# Patient Record
Sex: Female | Born: 1980 | Race: White | Hispanic: No | Marital: Married | Smoking: Never smoker
Health system: Southern US, Community
[De-identification: ages and names within clinical notes are randomized; demographics above are authoritative.]

---

## 2003-04-23 ENCOUNTER — Emergency Department (HOSPITAL_COMMUNITY): Admission: EM | Admit: 2003-04-23 | Discharge: 2003-04-24 | Payer: Self-pay | Admitting: Emergency Medicine

## 2003-04-24 ENCOUNTER — Encounter: Payer: Self-pay | Admitting: Emergency Medicine

## 2003-11-15 ENCOUNTER — Ambulatory Visit (HOSPITAL_COMMUNITY): Admission: RE | Admit: 2003-11-15 | Discharge: 2003-11-15 | Payer: Self-pay | Admitting: Family Medicine

## 2003-12-02 ENCOUNTER — Other Ambulatory Visit: Admission: RE | Admit: 2003-12-02 | Discharge: 2003-12-02 | Payer: Self-pay | Admitting: Obstetrics and Gynecology

## 2004-12-21 ENCOUNTER — Other Ambulatory Visit: Admission: RE | Admit: 2004-12-21 | Discharge: 2004-12-21 | Payer: Self-pay | Admitting: Obstetrics and Gynecology

## 2005-03-11 ENCOUNTER — Ambulatory Visit: Payer: Self-pay | Admitting: Internal Medicine

## 2005-06-05 ENCOUNTER — Ambulatory Visit (HOSPITAL_COMMUNITY): Admission: RE | Admit: 2005-06-05 | Discharge: 2005-06-05 | Payer: Self-pay | Admitting: Gynecology

## 2006-03-27 ENCOUNTER — Inpatient Hospital Stay (HOSPITAL_COMMUNITY): Admission: AD | Admit: 2006-03-27 | Discharge: 2006-03-30 | Payer: Self-pay | Admitting: Obstetrics and Gynecology

## 2007-05-08 ENCOUNTER — Inpatient Hospital Stay (HOSPITAL_COMMUNITY): Admission: RE | Admit: 2007-05-08 | Discharge: 2007-05-11 | Payer: Self-pay | Admitting: Obstetrics and Gynecology

## 2008-05-04 ENCOUNTER — Emergency Department (HOSPITAL_BASED_OUTPATIENT_CLINIC_OR_DEPARTMENT_OTHER): Admission: EM | Admit: 2008-05-04 | Discharge: 2008-05-04 | Payer: Self-pay | Admitting: Emergency Medicine

## 2009-10-04 ENCOUNTER — Emergency Department (HOSPITAL_BASED_OUTPATIENT_CLINIC_OR_DEPARTMENT_OTHER): Admission: EM | Admit: 2009-10-04 | Discharge: 2009-10-04 | Payer: Self-pay | Admitting: Emergency Medicine

## 2010-12-02 LAB — URINALYSIS, ROUTINE W REFLEX MICROSCOPIC
Glucose, UA: NEGATIVE mg/dL
Hgb urine dipstick: NEGATIVE
Specific Gravity, Urine: 1.024 (ref 1.005–1.030)

## 2011-01-29 NOTE — Op Note (Signed)
NAMELevita, [name]                 ACCOUNT NO.:  [id number]   MEDICAL RECORD NO.:  [id number]          PATIENT TYPE:  INP   LOCATION:  NA                            [facility]  [facility]   PHYSICIAN:  Carrington Clamp, M.D. DATE OF BIRTH:  October 03, 1980   DATE OF PROCEDURE:  05/08/2007  DATE OF DISCHARGE:                               OPERATIVE REPORT   PREOPERATIVE DIAGNOSIS:  Repeat cesarean section at term.   POSTOPERATIVE DIAGNOSIS:  Repeat cesarean section at term.   PROCEDURE:  Repeat cesarean section at term.   SURGEON:  Carrington Clamp, M.D.   ASSISTANT:  [name], M.D.   ANESTHESIA:  Spinal.   FINDINGS:  Female infant, vertex presentation, Apgars 9 and 9, 7 pounds 15  ounces, normal tubes, ovaries, and uterus seen.   SPECIMENS:  None.   ESTIMATED BLOOD LOSS:  800 mL.   URINE OUTPUT:  200 mL.   FLUIDS REPLACED:  3000 mL.   COMPLICATIONS:  None.   MEDICATIONS:  Ancef and Pitocin.   COUNTS:  Correct x3.   SURGICAL TECHNIQUE:  After adequate spinal anesthesia was achieved, the  patient was prepped and draped in the usual sterile fashion in the  dorsal supine position with a leftward tilt.  A Pfannenstiel skin  incision was made with the scalpel and carried down to the fascia with  the Bovie cautery.  The scalpel was used to incise the fascia in the  midline and carried in a transverse curving manner with the Mayo  scissors.  The fascia was reflected superiorly and inferiorly from the  rectus muscles with both sharp dissection with the Mayo scissors and the  Bovie cautery.  The scalpel was used to incise the midline vertical  until the peritoneum could be identified.  The peritoneum was then  opened with blunt dissection.  Some adhesions of the peritoneum to the  uterus was noted and the uterus had been incised in the upper portion of  the lower uterine segment already.  The bladder was well out of the  field of dissection, therefore, a bladder flap was not  created.   The scalpel was used to carry the current incision down to where the  placenta was noted on entry into the amnion.  The uterine incision was  stretched open and the amnion opened with blunt dissection.  The baby  was identified in a vertex presentation, vacuum bell was placed on the  baby's head, and one pull achieved delivery of the infant.  There was a  body cord noted as the baby was delivered.  The baby was bulb suctioned  on the field, the cord was clamped and cut, and the baby was handed to  the awaiting pediatrics.  The placenta was removed manually and the  uterus exteriorized, wrapped in a wet lap, and cleared of all debris.  The uterine incision was closed with running locked stitch of 0  Monocryl.  An imbricated layer of 0 Monocryl was performed, as well.  Some adhesions of the anterior peritoneum to the uterus were then able  to be  incised with Mayo scissors and with Bovie cautery.  The uterus was  reapproximated in the abdomen and the abdomen cleared of all debris with  irrigation.  The uterine incision was reinspected and found to be  hemostatic.   The peritoneum was then closed with 2-0 Vicryl in a running fashion.  The fascia was closed with 0 Vicryl in a running fashion.  The  subcutaneous tissue was rendered hemostatic with the Bovie cautery and  irrigation.  The skin was closed with staples.  The patient tolerated  the procedure well and was returned to the recovery room in stable  condition.      Carrington Clamp, M.D.  Electronically Signed     MH/MEDQ  D:  05/08/2007  T:  05/09/2007  Job:  [id number]

## 2011-02-01 NOTE — Op Note (Signed)
[name], [name]                 ACCOUNT NO.:  [id number]   MEDICAL RECORD NO.:  [id number]          PATIENT TYPE:  INP   LOCATION:  9129                          [facility]ILITY:  [facility]   PHYSICIAN:  Carrington Clamp, M.D. DATE OF BIRTH:  12-06-1980   DATE OF PROCEDURE:  03/28/2006  DATE OF DISCHARGE:                                 OPERATIVE REPORT   PREOPERATIVE DIAGNOSIS:  Arrest of active phase.   POSTOPERATIVE DIAGNOSIS:  Arrest of active phase.   PROCEDURE:  Primary low transverse cesarean section.   ATTENDING:  Carrington Clamp, M.D.   ANESTHESIA:  Epidural.   ESTIMATED BLOOD LOSS:  600 mL.   IV FLUIDS:  1500 mL.   URINE OUTPUT:  150 mL.   MEDICATION:  Pitocin, Methergine and Ancef.   COMPLICATIONS:  None.   FINDINGS:  Female infant, Apgars 9 and 10, vertex presentation, weight 8  pounds 7 ounces.  Normal tubes, ovaries and uterus were seen.  Pathology was  none.  Counts were correct x3.   TECHNIQUE:  After adequate epidural anesthesia was achieved, the patient was  prepped, draped usual sterile fashion in dorsal supine position with  leftward tilt.  A Pfannenstiel skin incision was made with the scalpel and  carried down to the fascia with Bovie cautery.  Fascia was incised in  midline with the scalpel and carried in transverse curvilinear manner with  the Mayo scissors.  The fascia was reflected superior and inferior from the  rectus muscles.  The rectus muscles split in midline.  The bowel free  portion peritoneum was entered into bluntly and the peritoneum was opened in  the superior and inferior manner with good visualization of bowel and the  bladder.   The bladder blade was placed.  The vesicouterine fascia tented up and  incised in transverse curvilinear manner.  The bladder flap was created  bluntly and the bladder blade replaced.  A 2 cm incision was made in the  upper portion of the lower uterine segment transversely until the amnion  could be seen.  The  bandage scissors were used to extend the incision in  transverse curvilinear manner.  Baby was identified in the vertex  presentation and delivered through the incision without complication.  The  baby was bulb suctioned and the cord was clamped and cut and baby handed to  waiting pediatrics.  Placenta was then delivered manually and the uterus  exteriorized, wrapped in wet lap, and cleared of all debris.  The uterine  incision was closed with running locked stitch of 0 Monocryl.  An  imbricating layer of 0 Monocryl was performed as well.   The uterus was reapproximated in the abdomen.  The abdomen cleared of all  debris with irrigation.  The uterine incision was reinspected, found to be  hemostatic.  The peritoneum was then closed with running stitch of 2-0  Vicryl.  The fascia was closed with running stitch of 0 Vicryl.  The  subcutaneous tissues were rendered hemostatic with the Bovie cautery and  irrigation.  The subcutaneous tissue was then closed with three interrupted  stitches of 2-0 plain gut.  The skin was closed with staples.  The patient  tolerated procedure well.  She returned to recovery in stable condition.      Carrington Clamp, M.D.  Electronically Signed     MH/MEDQ  D:  03/28/2006  T:  03/28/2006  Job:  [id number]

## 2011-02-01 NOTE — Discharge Summary (Signed)
NAMEVy, [name]                 ACCOUNT NO.:  [id number]   MEDICAL RECORD NO.:  [id number]          PATIENT TYPE:  INP   LOCATION:  9129                          [facility]   PHYSICIAN:  Ilda Mori, M.D.   DATE OF BIRTH:  March 23, 1981   DATE OF ADMISSION:  03/27/2006  DATE OF DISCHARGE:  03/30/2006                                 DISCHARGE SUMMARY   FINAL DIAGNOSES:  1. Intrauterine pregnancy at 40-1/[redacted] weeks gestation.  2. Induction of labor.  3. Arrest of the active phase of labor.   PROCEDURE:  Primary low transverse cesarean section.  Surgeon Dr. Carrington Clamp.  Complications none.   This 30 year old G1, P0 presents at 40-1/[redacted] weeks gestation for induction  secondary to post dates and a favorable cervix.  The patient had a negative  group B Strep culture obtained in our office at 36 weeks and has had an  uncomplicated pregnancy.  She was admitted at this time.  AROM was performed  and Pitocin was begun.  Patient had a protracted labor course.  Stayed at  about 6 cm of dilation.  Was diagnosed with the arrest of the active phase  of labor and was taken to the operating room on April 01, 2006 by Dr.  Carrington Clamp where a primary low transverse cesarean section was  performed with the delivery and an 8 pound 7 ounce female infant with Apgars  of 9 and 10.  Delivery went without complications.  Patient's postoperative  course was benign without any significant fevers.  She was felt ready for  discharge on postoperative day #2.  Was sent home on a regular diet, told to  decrease her activities, told to continue her vitamins.  Was given Tylox #30  one to two every four hours as needed for pain.  Was told she could use over-  the-counter ibuprofen up to 600 mg every six hours as needed for pain.  Was  to return our office on July 16 for her staple removal, of course to call  with any increased bleeding, pain, or problems.   LABS ON DISCHARGE:  Patient had a hemoglobin of  9.3, white blood cell count  of 12.5, and platelets of 242,000.      Leilani Able, P.A.-C.      Ilda Mori, M.D.  Electronically Signed    MB/MEDQ  D:  05/05/2006  T:  05/05/2006  Job:  [id number]

## 2011-02-01 NOTE — Discharge Summary (Signed)
NAMEMagaly, [name]                 ACCOUNT NO.:  [id number]   MEDICAL RECORD NO.:  [id number]          PATIENT TYPE:  INP   LOCATION:  9136                          [facility]   PHYSICIAN:  Randye Lobo, M.D.   DATE OF BIRTH:  Mar 04, 1981   DATE OF ADMISSION:  05/08/2007  DATE OF DISCHARGE:  05/11/2007                               DISCHARGE SUMMARY   DISCHARGE DIAGNOSES:  1. Intrauterine pregnancy at 53 weeks' gestation.  2. History of prior cesarean section.  The patient desires repeat      cesarean section.   PROCEDURE:  Repeat low transverse cesarean section.  Surgeon Dr.  Carrington Clamp, assistant Dr. Lodema Hong.  Complications none.   HOSPITAL COURSE:  This 30 year old, G2, P1-0-0-1 presents at term for  repeat cesarean section.  The patient had a prior cesarean section with  her last pregnancy and desires a repeat with this pregnancy as well.  The patient's antepartum course has been uncomplicated.  She did have a  negative Group B Streptococcus culture obtained in our office at 36  weeks.  She is being taken to the operating room on May 08, 2007, by  Dr. Carrington Clamp where a repeat low transverse cesarean section is  performed with the delivery of a 7 pound 15 ounce, female infant with  Apgar's of 9 and 9.  The delivery went without complications.  The  patient's postoperative course was benign without any significant  fevers.  She was felt ready for discharge on postop day #3.   DIET:  She was sent home on a regular diet.   ACTIVITY:  She was told to decrease activities.   DISCHARGE MEDICATIONS:  1. Continue her prenatal vitamins and iron supplement two to three      times daily.  2. Percocet one to two every 4-6 hours as needed for pain.  3. She was told she could also use ibuprofen up to 600 mg every 6      hours as needed for pain.   FOLLOW UP:  She was to follow up in our office in 2 weeks to recheck her  incision.  Instructions and precautions were  reviewed with the patient.   DISCHARGE LABORATORY DATA AND X-RAY FINDINGS:  The patient had a  hemoglobin of 9.4, white blood cell count of 10.6, platelets 236,000.  She also a rapid HIV screen which was nonreactive.      Leilani Able, P.A.-C.      Randye Lobo, M.D.  Electronically Signed    MB/MEDQ  D:  06/10/2007  T:  06/11/2007  Job:  [id number]

## 2011-04-01 ENCOUNTER — Other Ambulatory Visit: Payer: Self-pay | Admitting: Orthopaedic Surgery

## 2011-04-01 DIAGNOSIS — M545 Low back pain, unspecified: Secondary | ICD-10-CM

## 2011-04-03 ENCOUNTER — Other Ambulatory Visit: Payer: Self-pay

## 2011-04-11 ENCOUNTER — Other Ambulatory Visit: Payer: Self-pay

## 2011-06-28 LAB — CBC
HCT: 34.3 — ABNORMAL LOW
Hemoglobin: 9.1 — ABNORMAL LOW
MCHC: 33.5
MCHC: 33.8
MCV: 77.6 — ABNORMAL LOW
MCV: 77.8 — ABNORMAL LOW
MCV: 78.5
RBC: 3.47 — ABNORMAL LOW
RBC: 3.57 — ABNORMAL LOW
RBC: 4.43
WBC: 8.2

## 2011-06-28 LAB — RAPID HIV SCREEN (WH-MAU): Rapid HIV Screen: NONREACTIVE

## 2012-12-09 ENCOUNTER — Ambulatory Visit: Payer: Self-pay | Admitting: *Deleted

## 2012-12-10 ENCOUNTER — Ambulatory Visit: Payer: Self-pay | Admitting: *Deleted

## 2012-12-29 ENCOUNTER — Encounter: Payer: Self-pay | Admitting: *Deleted

## 2012-12-30 ENCOUNTER — Ambulatory Visit: Payer: Self-pay | Admitting: *Deleted

## 2013-12-26 ENCOUNTER — Emergency Department (HOSPITAL_BASED_OUTPATIENT_CLINIC_OR_DEPARTMENT_OTHER)
Admission: EM | Admit: 2013-12-26 | Discharge: 2013-12-27 | Disposition: A | Discharge status: Home / Self Care | Payer: BC Managed Care – PPO | Attending: Emergency Medicine | Admitting: Emergency Medicine

## 2013-12-26 ENCOUNTER — Encounter (HOSPITAL_BASED_OUTPATIENT_CLINIC_OR_DEPARTMENT_OTHER): Payer: Self-pay | Admitting: Emergency Medicine

## 2013-12-26 DIAGNOSIS — R197 Diarrhea, unspecified: Secondary | ICD-10-CM | POA: Insufficient documentation

## 2013-12-26 DIAGNOSIS — R112 Nausea with vomiting, unspecified: Secondary | ICD-10-CM | POA: Insufficient documentation

## 2013-12-26 DIAGNOSIS — R52 Pain, unspecified: Secondary | ICD-10-CM | POA: Insufficient documentation

## 2013-12-26 DIAGNOSIS — Z3202 Encounter for pregnancy test, result negative: Secondary | ICD-10-CM | POA: Insufficient documentation

## 2013-12-26 DIAGNOSIS — R000 Tachycardia, unspecified: Secondary | ICD-10-CM | POA: Insufficient documentation

## 2013-12-26 LAB — COMPREHENSIVE METABOLIC PANEL
ALK PHOS: 73 U/L (ref 39–117)
ALT: 22 U/L (ref 0–35)
AST: 21 U/L (ref 0–37)
Albumin: 4.9 g/dL (ref 3.5–5.2)
BILIRUBIN TOTAL: 0.8 mg/dL (ref 0.3–1.2)
BUN: 11 mg/dL (ref 6–23)
CHLORIDE: 101 meq/L (ref 96–112)
CO2: 23 meq/L (ref 19–32)
Calcium: 9.7 mg/dL (ref 8.4–10.5)
Creatinine, Ser: 0.6 mg/dL (ref 0.50–1.10)
GFR calc Af Amer: >90 mL/min (ref >90)
GLUCOSE: 123 mg/dL — AB (ref 70–99)
POTASSIUM: 4 meq/L (ref 3.7–5.3)
SODIUM: 140 meq/L (ref 137–147)
Total Protein: 8.3 g/dL (ref 6.0–8.3)

## 2013-12-26 LAB — CBC WITH DIFFERENTIAL/PLATELET
BASOS ABS: 0 10*3/uL (ref 0.0–0.1)
Basophils Relative: 0 % (ref 0–1)
Eosinophils Absolute: 0.1 10*3/uL (ref 0.0–0.7)
Eosinophils Relative: 1 % (ref 0–5)
HCT: 43.8 % (ref 36.0–46.0)
Hemoglobin: 15 g/dL (ref 12.0–15.0)
LYMPHS ABS: 0.3 10*3/uL — AB (ref 0.7–4.0)
LYMPHS PCT: 2 % — AB (ref 12–46)
MCH: 29.8 pg (ref 26.0–34.0)
MCHC: 34.2 g/dL (ref 30.0–36.0)
MCV: 87.1 fL (ref 78.0–100.0)
Monocytes Absolute: 0.7 10*3/uL (ref 0.1–1.0)
Monocytes Relative: 5 % (ref 3–12)
NEUTROS ABS: 13.9 10*3/uL — AB (ref 1.7–7.7)
Neutrophils Relative %: 93 % — ABNORMAL HIGH (ref 43–77)
PLATELETS: 310 10*3/uL (ref 150–400)
RBC: 5.03 MIL/uL (ref 3.87–5.11)
RDW: 12.3 % (ref 11.5–15.5)
WBC: 15.1 10*3/uL — AB (ref 4.0–10.5)

## 2013-12-26 MED ORDER — LORAZEPAM 2 MG/ML IJ SOLN
1.0000 mg | Freq: Once | INTRAMUSCULAR | Status: DC
Start: 2013-12-26 — End: 2013-12-27

## 2013-12-26 MED ORDER — ONDANSETRON HCL 4 MG/2ML IJ SOLN
4.0000 mg | Freq: Once | INTRAMUSCULAR | Status: AC
  Administered 2013-12-26: 4 mg via INTRAVENOUS

## 2013-12-26 MED ORDER — ONDANSETRON HCL 4 MG/2ML IJ SOLN
INTRAMUSCULAR | Status: AC
  Administered 2013-12-26: 4 mg via INTRAVENOUS
  Filled 2013-12-26: qty 2

## 2013-12-26 MED ORDER — SODIUM CHLORIDE 0.9 % IV BOLUS (SEPSIS)
1000.0000 mL | Freq: Once | INTRAVENOUS | Status: AC
  Administered 2013-12-26: 1000 mL via INTRAVENOUS

## 2013-12-26 MED ORDER — SODIUM CHLORIDE 0.9 % IV BOLUS (SEPSIS)
1000.0000 mL | Freq: Once | INTRAVENOUS | Status: AC
Start: 2013-12-26 — End: 2013-12-27
  Administered 2013-12-27: 1000 mL via INTRAVENOUS

## 2013-12-26 MED ORDER — SODIUM CHLORIDE 0.9 % IV BOLUS (SEPSIS)
1000.0000 mL | Freq: Once | INTRAVENOUS | Status: AC
  Administered 2013-12-26 (×2): 1000 mL via INTRAVENOUS

## 2013-12-26 NOTE — ED Notes (Signed)
Patient states that her child is at home and has started having N/V/D as well.

## 2013-12-26 NOTE — ED Notes (Signed)
Pt tolerating PO fluids at this time with no N/V/D noted.

## 2013-12-26 NOTE — ED Notes (Signed)
Pt reports abd pain onset at 1400 that has accompanying nausea vomitingand diarrhea

## 2013-12-26 NOTE — ED Provider Notes (Signed)
CSN: [id number]     Arrival date & time 12/26/13  2058 History  This chart was scribed for Ethelda ChickMartha K Linker, MD by Beverly MilchJ Harrison Collins, ED Scribe. This patient was seen in room MH10/MH10 and the patient's care was started at 10:47 PM.    Chief Complaint  Patient presents with  . Abdominal Pain    with N/V/D      Patient is a 33 y.o. female presenting with abdominal pain. The history is provided by the patient. No language interpreter was used.  Abdominal Pain Pain severity:  Moderate Onset quality:  Gradual Duration:  8 hours Timing:  Constant Progression:  Worsening Chronicity:  New Context: sick contacts   Relieved by:  Nothing Worsened by:  Eating and vomiting Ineffective treatments:  Liquids Associated symptoms: diarrhea, nausea and vomiting   Associated symptoms: no chills and no fever   Pt denies any chronic medical problems.   History reviewed. No pertinent past medical history. History reviewed. No pertinent past surgical history. History reviewed. No pertinent family history. History  Substance Use Topics  . Smoking status: Never Smoker   . Smokeless tobacco: Never Used  . Alcohol Use: No   OB History   Grav Para Term Preterm Abortions TAB SAB Ect Mult Living                 Review of Systems  Constitutional: Negative for fever and chills.  Gastrointestinal: Positive for nausea, vomiting, abdominal pain and diarrhea.  All other systems reviewed and are negative.     Allergies  Pseudoephedrine  Home Medications   Current Outpatient Rx  Name  Route  Sig  Dispense  Refill  . ondansetron (ZOFRAN) 4 MG tablet   Oral   Take 1 tablet (4 mg total) by mouth every 6 (six) hours.   12 tablet   0     Triage Vitals: BP 110/80  Pulse 144  Temp(Src) 99.1 F (37.3 C) (Oral)  SpO2 100%  LMP 12/26/2008  Physical Exam  Nursing note and vitals reviewed. Constitutional: She appears well-developed and well-nourished. No distress.  HENT:  Head: Normocephalic  and atraumatic.  Right Ear: External ear normal.  Left Ear: External ear normal.  Mouth/Throat: Mucous membranes are dry.  Eyes: Conjunctivae are normal. Right eye exhibits no discharge. Left eye exhibits no discharge. No scleral icterus.  Neck: Neck supple. No tracheal deviation present.  Cardiovascular: Normal rate, regular rhythm and intact distal pulses.   Pulmonary/Chest: Effort normal and breath sounds normal. No stridor. No respiratory distress. She has no wheezes. She has no rales.  Abdominal: Soft. Bowel sounds are normal. She exhibits no distension. There is no tenderness. There is no rebound and no guarding.  Musculoskeletal: She exhibits no edema and no tenderness.  Neurological: She is alert. She has normal strength. No cranial nerve deficit (no facial droop, extraocular movements intact, no slurred speech) or sensory deficit. She exhibits normal muscle tone. She displays no seizure activity. Coordination normal.  Skin: Skin is warm and dry. No rash noted.  Psychiatric: She has a normal mood and affect.    ED Course  Procedures (including critical care time)  DIAGNOSTIC STUDIES: Oxygen Saturation is 100% on RA, normal by my interpretation.    COORDINATION OF CARE: 10:50 PM- Pt advised of plan for treatment and pt agrees.  12:09 AM urinalysis is reassuring with the exception of ketonuria. Pt is feeling much improved after fluids and  Zofran. She has tolerated a po fluid trial.  She states she has anxiety about hospitals and feels very anxious.  States her heart rate "will probably go down as soon as I leave, I am very anxious being here".  She is smilling and cooperative, states she feels much improved.  Her father endorses that she has had similar anxiety with medical situations since childhood.   12:15 AM HR on monitor was 96 when I went into room, then increased to 120s. Pt states this is usual for her.     Labs Review Labs Reviewed  URINALYSIS, ROUTINE W REFLEX  MICROSCOPIC - Abnormal; Notable for the following:    Ketones, ur >80 (*)    All other components within normal limits  CBC WITH DIFFERENTIAL - Abnormal; Notable for the following:    WBC 15.1 (*)    Neutrophils Relative % 93 (*)    Neutro Abs 13.9 (*)    Lymphocytes Relative 2 (*)    Lymphs Abs 0.3 (*)    All other components within normal limits  COMPREHENSIVE METABOLIC PANEL - Abnormal; Notable for the following:    Glucose, Bld 123 (*)    All other components within normal limits  PREGNANCY, URINE   Imaging Review No results found.   EKG Interpretation None      MDM   Final diagnoses:  Nausea vomiting and diarrhea   Pt presenting with c/o nausea, vomiting and diarrhea- acute in onset, no abdominal pain or tenderness.  No fever/chills.  She has another household member with similar symptoms.  No recent travel.  She feels much improved after zofran and IV hydration.  Her tachycardia has improved.  See note above.  I have rechecked patient multiple times and she is agreeable with plan for home.  Discharged with strict return precautions.  Pt agreeable with plan.  I personally performed the services described in this documentation, which was scribed in my presence. The recorded information has been reviewed and is accurate.     Ethelda Chick, MD 12/29/13 [phone]

## 2013-12-27 LAB — URINALYSIS, ROUTINE W REFLEX MICROSCOPIC
BILIRUBIN URINE: NEGATIVE
Glucose, UA: NEGATIVE mg/dL
HGB URINE DIPSTICK: NEGATIVE
Ketones, ur: >80 mg/dL — AB
Leukocytes, UA: NEGATIVE
NITRITE: NEGATIVE
PH: 5.5 (ref 5.0–8.0)
Protein, ur: NEGATIVE mg/dL
SPECIFIC GRAVITY, URINE: 1.017 (ref 1.005–1.030)
UROBILINOGEN UA: 0.2 mg/dL (ref 0.0–1.0)

## 2013-12-27 LAB — PREGNANCY, URINE: PREG TEST UR: NEGATIVE

## 2013-12-27 MED ORDER — ONDANSETRON HCL 4 MG PO TABS: 4.0000 mg | ORAL_TABLET | Freq: Four times a day (QID) | ORAL | Status: DC

## 2013-12-27 MED ORDER — ONDANSETRON HCL 4 MG/2ML IJ SOLN
4.0000 mg | Freq: Once | INTRAMUSCULAR | Status: AC
  Administered 2013-12-27: 4 mg via INTRAVENOUS
  Filled 2013-12-27: qty 2

## 2013-12-27 NOTE — Discharge Instructions (Signed)
Return to the ED with any concerns including vomiting and not able to keep down liquids, abdominal pain- especially if it localizes to the right lower abdomen, fever/chills, fainting, decreased level of alertness/lethargy, or any other alarming symptoms

## 2015-03-29 ENCOUNTER — Ambulatory Visit: Payer: BLUE CROSS/BLUE SHIELD | Admitting: Family Medicine

## 2015-08-29 ENCOUNTER — Other Ambulatory Visit: Payer: Self-pay | Admitting: Sports Medicine

## 2015-08-29 DIAGNOSIS — M545 Low back pain: Secondary | ICD-10-CM

## 2015-08-29 DIAGNOSIS — M79605 Pain in left leg: Secondary | ICD-10-CM

## 2015-09-12 ENCOUNTER — Ambulatory Visit
Admission: RE | Admit: 2015-09-12 | Discharge: 2015-09-12 | Disposition: A | Discharge status: Home / Self Care | Payer: BLUE CROSS/BLUE SHIELD | Source: Ambulatory Visit | Attending: Sports Medicine | Admitting: Sports Medicine

## 2015-09-12 DIAGNOSIS — M79605 Pain in left leg: Secondary | ICD-10-CM

## 2015-09-12 DIAGNOSIS — M545 Low back pain: Secondary | ICD-10-CM

## 2015-09-21 ENCOUNTER — Ambulatory Visit: Payer: BLUE CROSS/BLUE SHIELD | Admitting: Physical Therapy

## 2015-09-26 ENCOUNTER — Ambulatory Visit: Payer: BLUE CROSS/BLUE SHIELD | Attending: Sports Medicine | Admitting: Physical Therapy

## 2015-09-26 DIAGNOSIS — M5442 Lumbago with sciatica, left side: Secondary | ICD-10-CM | POA: Insufficient documentation

## 2015-09-26 DIAGNOSIS — M5126 Other intervertebral disc displacement, lumbar region: Secondary | ICD-10-CM | POA: Insufficient documentation

## 2015-10-04 ENCOUNTER — Ambulatory Visit: Payer: BLUE CROSS/BLUE SHIELD | Admitting: Physical Therapy

## 2015-10-04 ENCOUNTER — Encounter: Payer: Self-pay | Admitting: Physical Therapy

## 2015-10-04 DIAGNOSIS — M5442 Lumbago with sciatica, left side: Secondary | ICD-10-CM

## 2015-10-04 DIAGNOSIS — M5126 Other intervertebral disc displacement, lumbar region: Secondary | ICD-10-CM | POA: Diagnosis present

## 2015-10-04 DIAGNOSIS — M5136 Other intervertebral disc degeneration, lumbar region: Secondary | ICD-10-CM

## 2015-10-04 NOTE — Therapy (Signed)
[address] [place], [place], [address] Phone: [phone]   Fax:  [phone]  Physical Therapy Evaluation  Patient Details  Name: [name] MRN: [id number] Date of Birth: 08/13/1981 Referring Provider: Alfonso Ramus  Encounter Date: 10/04/2015      PT End of Session - 10/04/15 0840    Visit Number 1   PT Start Time 0808   PT Stop Time 0858   PT Time Calculation (min) 50 min   Activity Tolerance Patient tolerated treatment well   Behavior During Therapy [facility] for tasks assessed/performed      History reviewed. No pertinent past medical history.  History reviewed. No pertinent past surgical history.  There were no vitals filed for this visit.  Visit Diagnosis:  Left-sided low back pain with left-sided sciatica - Plan: PT plan of care cert/re-cert  X5-Q0 disc bulge - Plan: PT plan of care cert/re-cert      Subjective Assessment - 10/04/15 0808    Subjective Patient reports that she has had a bulging disc for a number of years.  She reports that however recently she has been having left leg pain.  She had an MRI that showed a bulging disc at L5.   Limitations Lifting;Standing;House hold activities   Patient Stated Goals have less pain   Currently in Pain? Yes   Pain Score 3    Pain Location Back   Pain Orientation Left;Lower   Pain Descriptors / Indicators Aching   Pain Type Chronic pain   Pain Onset More than a month ago   Pain Frequency Constant   Aggravating Factors  at the end of the day pain is an 8-9/10.  Walking and standing 20-30 minutes will increase ain   Pain Relieving Factors better in the AM, lying down at best pain is a 2-3/10   Effect of Pain on Daily Activities difficulty standing, sitting, and walking            [facility] PT Assessment - 10/04/15 0001    Assessment   Medical Diagnosis low back pain   Referring Provider Alfonso Ramus   Onset Date/Surgical Date 09/03/15   Prior  Therapy n   Precautions   Precautions None   Balance Screen   Has the patient fallen in the past 6 months No   Has the patient had a decrease in activity level because of a fear of falling?  No   Is the patient reluctant to leave their home because of a fear of falling?  No   Home Environment   Additional Comments housework   Prior Function   Level of Independence Independent   Vocation Full time employment   Vocation Requirements Sitting   Leisure no exercise   Posture/Postural Control   Posture Comments fwd head, rounded shoulders   AROM   Overall AROM Comments AROM of the lumbar spine was decreased 25% for all motions except extension was decreased 50%, all motions caused pain , extension caused more pain   Strength   Overall Strength Comments 4/5 for the LE's with mild pain   Palpation   SI assessment  left leg longer in supine and = in long sitting   Palpation comment tight in the lumbar area, mild tenderness in the left SI   Special Tests    Special Tests --  + slump and SLR on the left  PT Education - 10/04/15 [phone]    Education provided Yes   Education Details educated in sheet traction, SI MET and McKenzie extension   Person(s) Educated Patient   Methods Explanation;Demonstration;Handout   Comprehension Verbalized understanding          PT Short Term Goals - 10/04/15 0849    PT SHORT TERM GOAL #1   Title independent with initial HEP   Time 2   Period Weeks   Status New           PT Long Term Goals - 10/04/15 0849    PT LONG TERM GOAL #1   Title understand proper posture and body mechanics   Time 8   Period Weeks   Status New   PT LONG TERM GOAL #2   Title increase lumbar ROM WNL's   Time 8   Period Weeks   Status New   PT LONG TERM GOAL #3   Title tolerate standing > 30 minutes   Time 8   Period Weeks   Status New   PT LONG TERM GOAL #4   Title negative Slump test   Time 8   Period Weeks    Status New               Plan - 10/04/15 0840    Clinical Impression Statement Patient with some back pain for a number of years, has a disc bulge at L5.  Had a possible innominate rotation of the left.  Has a + SLR and slump test on the left.  Job is sitting all day.   Pt will benefit from skilled therapeutic intervention in order to improve on the following deficits Decreased range of motion;Decreased strength;Increased muscle spasms;Impaired flexibility;Postural dysfunction;Improper body mechanics;Pain   Rehab Potential Good   PT Frequency 2x / week   PT Duration 8 weeks   PT Treatment/Interventions ADLs/Self Care Home Management;Electrical Stimulation;Moist Heat;Therapeutic exercise;Therapeutic activities;Ultrasound;Traction;Patient/family education;Manual techniques;Passive range of motion   PT Next Visit Plan May try traction next visit, instruct on gym activities   Consulted and Agree with Plan of Care Patient         Problem List There are no active problems to display for this patient.   Sumner Boast., PT 10/04/2015, 9:03 AM  [address] [place], [place], [address] Phone: [phone]   Fax:  [phone]  Name: [name] MRN: [id number] Date of Birth: [birth year]

## 2015-10-09 ENCOUNTER — Ambulatory Visit: Payer: BLUE CROSS/BLUE SHIELD | Admitting: Physical Therapy

## 2015-10-09 ENCOUNTER — Encounter: Payer: Self-pay | Admitting: Physical Therapy

## 2015-10-09 DIAGNOSIS — M5442 Lumbago with sciatica, left side: Secondary | ICD-10-CM | POA: Diagnosis not present

## 2015-10-09 DIAGNOSIS — M5136 Other intervertebral disc degeneration, lumbar region: Secondary | ICD-10-CM

## 2015-10-09 DIAGNOSIS — M5126 Other intervertebral disc displacement, lumbar region: Secondary | ICD-10-CM

## 2015-10-09 NOTE — Therapy (Signed)
[facility] [address] [place], [place], [address] Phone: [phone]   Fax:  [phone]  Physical Therapy Treatment  Patient Details  Name: SHERA LAUBACH MRN: [id number] Date of Birth: [birth year] Referring Provider: Farris Has  Encounter Date: 10/09/2015      PT End of Session - 10/09/15 1609    Visit Number 2   Date for PT Re-Evaluation 11/29/15   PT Start Time 1539   PT Stop Time 1631   PT Time Calculation (min) 52 min   Activity Tolerance Patient tolerated treatment well   Behavior During Therapy [facility] for tasks assessed/performed      History reviewed. No pertinent past medical history.  History reviewed. No pertinent past surgical history.  There were no vitals filed for this visit.  Visit Diagnosis:  Left-sided low back pain with left-sided sciatica  L4-L5 disc bulge      Subjective Assessment - 10/09/15 1548    Subjective Felt goods after, but then it came back.  Sore.  No questions with exercises.   Currently in Pain? Yes   Pain Score 3    Pain Location Back   Pain Orientation Left;Lower   Pain Descriptors / Indicators Aching   Pain Type Chronic pain                         [facility] Adult PT Treatment/Exercise - 10/09/15 0001    Exercises   Exercises Lumbar   Lumbar Exercises: Stretches   Passive Hamstring Stretch 20 seconds;3 reps   Piriformis Stretch 20 seconds;3 reps   Lumbar Exercises: Aerobic   UBE (Upper Arm Bike) NuStep Level 5x5 minutes   Lumbar Exercises: Machines for Strengthening   Other Lumbar Machine Exercise seated row 20# lats 20# 2x10   Modalities   Modalities Electrical Stimulation;Moist Heat;Traction   Moist Heat Therapy   Number Minutes Moist Heat 15 Minutes   Moist Heat Location Lumbar Spine   Electrical Stimulation   Electrical Stimulation Location lumbar    Electrical Stimulation Action IFC   Electrical Stimulation Parameters supine   Electrical  Stimulation Goals Pain   Traction   Type of Traction Lumbar   Max (lbs) 60   Hold Time static   Time 15                  PT Short Term Goals - 10/04/15 0849    PT SHORT TERM GOAL #1   Title independent with initial HEP   Time 2   Period Weeks   Status New           PT Long Term Goals - 10/04/15 0849    PT LONG TERM GOAL #1   Title understand proper posture and body mechanics   Time 8   Period Weeks   Status New   PT LONG TERM GOAL #2   Title increase lumbar ROM WNL's   Time 8   Period Weeks   Status New   PT LONG TERM GOAL #3   Title tolerate standing > 30 minutes   Time 8   Period Weeks   Status New   PT LONG TERM GOAL #4   Title negative Slump test   Time 8   Period Weeks   Status New               Plan - 10/09/15 1610    Clinical Impression Statement Patient had mild tightness in the HS.  Tolerated exercises without an increase of pain.   PT Next Visit Plan see if traction helped, continue with progression of gym and stability exercises.   Consulted and Agree with Plan of Care Patient        Problem List There are no active problems to display for this patient.   Jearld Lesch., PT 10/09/2015, 4:11 PM  [facility] [address] [place], [place], [address] Phone: [phone]   Fax:  [phone]  Name: FARRIS BLASH MRN: [id number] Date of Birth: 1981-08-21

## 2015-10-16 ENCOUNTER — Ambulatory Visit: Payer: BLUE CROSS/BLUE SHIELD | Admitting: Physical Therapy

## 2015-10-23 ENCOUNTER — Ambulatory Visit: Payer: BLUE CROSS/BLUE SHIELD | Admitting: Physical Therapy

## 2015-10-26 ENCOUNTER — Ambulatory Visit: Payer: BLUE CROSS/BLUE SHIELD | Admitting: Physical Therapy

## 2015-10-31 ENCOUNTER — Encounter: Payer: Self-pay | Admitting: Physical Therapy

## 2015-10-31 ENCOUNTER — Ambulatory Visit: Payer: BLUE CROSS/BLUE SHIELD | Attending: Sports Medicine | Admitting: Physical Therapy

## 2015-10-31 DIAGNOSIS — M5442 Lumbago with sciatica, left side: Secondary | ICD-10-CM | POA: Insufficient documentation

## 2015-10-31 DIAGNOSIS — M5126 Other intervertebral disc displacement, lumbar region: Secondary | ICD-10-CM | POA: Diagnosis not present

## 2015-10-31 DIAGNOSIS — M5136 Other intervertebral disc degeneration, lumbar region: Secondary | ICD-10-CM

## 2015-10-31 NOTE — Therapy (Addendum)
[facility] [address] [place], [place], [address] Phone: [phone]   Fax:  [phone]  Physical Therapy Treatment  Patient Details  Name: [name] MRN: [id number] Date of Birth: 03/01/1981 Referring Provider: Alfonso Ramus  Encounter Date: 10/31/2015      PT End of Session - 10/31/15 1052    Visit Number 3   Date for PT Re-Evaluation 11/29/15   PT Start Time 1021   PT Stop Time 1105   PT Time Calculation (min) 44 min   Activity Tolerance Patient tolerated treatment well   Behavior During Therapy [facility] for tasks assessed/performed      History reviewed. No pertinent past medical history.  History reviewed. No pertinent past surgical history.  There were no vitals filed for this visit.  Visit Diagnosis:  L4-L5 disc bulge  Left-sided low back pain with left-sided sciatica      Subjective Assessment - 10/31/15 1022    Subjective Pt reports that she was doing good until she decided to play golf Sunday. Pt stated that she got through 12 holes before she had to stop but to LBP what went down her LLE    Currently in Pain? Yes   Pain Score 5    Pain Location Back   Pain Orientation Left                         [facility] Adult PT Treatment/Exercise - 10/31/15 0001    Lumbar Exercises: Stretches   Passive Hamstring Stretch 4 reps;10 seconds   Single Knee to Chest Stretch 1 rep;10 seconds   Piriformis Stretch 4 reps;10 seconds   Lumbar Exercises: Aerobic   Stationary Bike L2 x6 min    Lumbar Exercises: Machines for Strengthening   Leg Press #20 2x10    Other Lumbar Machine Exercise seated row 20# lats 20# 2x15   Lumbar Exercises: Standing   Other Standing Lumbar Exercises Standing hip ext/ abd add #5 2x10    Traction   Type of Traction Lumbar   Max (lbs) 62   Hold Time static   Time 15                  PT Short Term Goals - 10/31/15 1055    PT SHORT TERM GOAL #1   Title  independent with initial HEP   Status Achieved           PT Long Term Goals - 10/04/15 0849    PT LONG TERM GOAL #1   Title understand proper posture and body mechanics   Time 8   Period Weeks   Status New   PT LONG TERM GOAL #2   Title increase lumbar ROM WNL's   Time 8   Period Weeks   Status New   PT LONG TERM GOAL #3   Title tolerate standing > 30 minutes   Time 8   Period Weeks   Status New   PT LONG TERM GOAL #4   Title negative Slump test   Time 8   Period Weeks   Status New               Plan - 10/31/15 1053    Clinical Impression Statement Pt 6 minutes late for PT treatment, Tolerated all interventions well without c/o increase pain. Pt does have bilat HS tightness L >R. Pt report piriformis stretch feels good.   Pt will benefit from skilled therapeutic intervention  in order to improve on the following deficits Decreased range of motion;Decreased strength;Increased muscle spasms;Impaired flexibility;Postural dysfunction;Improper body mechanics;Pain   Rehab Potential Good   PT Frequency 2x / week   PT Duration 8 weeks   PT Treatment/Interventions ADLs/Self Care Home Management;Electrical Stimulation;Moist Heat;Therapeutic exercise;Therapeutic activities;Ultrasound;Traction;Patient/family education;Manual techniques;Passive range of motion   PT Next Visit Plan  continue with progression of gym and stability exercises and traction     PHYSICAL THERAPY DISCHARGE SUMMARY  Visits from Start of Care: 3   Plan: Patient agrees to discharge.  Patient goals were partially met. Patient is being discharged due to being pleased with the current functional level.  ?????        Problem List There are no active problems to display for this patient.   Scot Jun, PTA 10/31/2015, 10:56 AM  [address] [place], [place], [address] Phone: [phone]   Fax:   [phone]  Name: [name] MRN: [id number] Date of Birth: [birth year]

## 2015-11-08 ENCOUNTER — Ambulatory Visit: Payer: BLUE CROSS/BLUE SHIELD | Admitting: Physical Therapy

## 2015-12-21 DIAGNOSIS — Z20818 Contact with and (suspected) exposure to other bacterial communicable diseases: Secondary | ICD-10-CM | POA: Diagnosis not present

## 2015-12-21 DIAGNOSIS — J029 Acute pharyngitis, unspecified: Secondary | ICD-10-CM | POA: Diagnosis not present

## 2016-02-21 ENCOUNTER — Ambulatory Visit (INDEPENDENT_AMBULATORY_CARE_PROVIDER_SITE_OTHER): Payer: BLUE CROSS/BLUE SHIELD | Admitting: Family Medicine

## 2016-02-21 ENCOUNTER — Encounter: Payer: Self-pay | Admitting: Family Medicine

## 2016-02-21 VITALS — BP 129/84 | HR 103 | Ht 63.0 in | Wt 145.0 lb

## 2016-02-21 DIAGNOSIS — M545 Low back pain: Secondary | ICD-10-CM | POA: Diagnosis not present

## 2016-02-21 DIAGNOSIS — M79604 Pain in right leg: Secondary | ICD-10-CM

## 2016-02-21 DIAGNOSIS — M79605 Pain in left leg: Secondary | ICD-10-CM

## 2016-02-21 MED ORDER — DICLOFENAC SODIUM 75 MG PO TBEC: 75.0000 mg | DELAYED_RELEASE_TABLET | Freq: Two times a day (BID) | ORAL | Status: DC

## 2016-02-21 NOTE — Patient Instructions (Signed)
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 You do have a disc bulge that can affect your L5 nerves on both sides as well but your exam is very reassuring. Take diclofenac 75mg  twice a day with food for pain and inflammation. Heat as needed for spasms 15 minutes at a time 3-4 times a day. I would recommend physical therapy or seeing the chiropractor Regulatory affairs officer([employer] [phone]). Consider prednisone dose pack if your pain worsens instead of improves. I don't think you need additional imaging at this time. If you notice other joints swelling, warm, and/or red give me a call to evaluate this - would then consider rheumatology referral. I think you'll need to do home exercises 3 times a week indefinitely to help prevent your back and leg pain from flaring back up in the future once this improves. Follow up with me in 1 month for reevaluation.

## 2016-02-22 DIAGNOSIS — M545 Low back pain, unspecified: Secondary | ICD-10-CM | POA: Insufficient documentation

## 2016-02-22 DIAGNOSIS — M79604 Pain in right leg: Secondary | ICD-10-CM | POA: Insufficient documentation

## 2016-02-22 NOTE — Assessment & Plan Note (Signed)
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  Start with diclofenac twice a day, heat for spasms.  Shown home exercises and stretches to do daily.  We reviewed physical therapy vs chiropractic care - she will consider which to pursue.  Consider prednisone dose pack if not improving.  F/u in 1 month for reevaluation.  No evidence on exam or MRI of an inflammatory arthropathy.

## 2016-02-22 NOTE — Progress Notes (Signed)
PCP: Johny BlamerHARRIS, WILLIAM, MD  Subjective:   HPI: Patient is a 35 y.o. female here for bilateral leg pain.  Patient reports she's had years of low back pain. This flares up intermittently and is usually associated with radiation into her left leg. Current pain is different - in both legs radiating down. Associated with aching, throbbing from posterior hips down legs to feet. No numbness or tingling. Feels more stiff with immobilization, prolonged sitting or lying down. Pain seems worse with high level of activity too. Notices fingers will intermittently feel stiff but not noticed any swelling, warmth, other joint involvement. Pain level 5/10, sharp. No bowel/bladder dysfunction.  No past medical history on file.  No current outpatient prescriptions on file prior to visit.   No current facility-administered medications on file prior to visit.    No past surgical history on file.  Allergies  Allergen Reactions  . Pseudoephedrine     Social History   Social History  . Marital Status: Married    Spouse Name: N/A  . Number of Children: N/A  . Years of Education: N/A   Occupational History  . Not on file.   Social History Main Topics  . Smoking status: Never Smoker   . Smokeless tobacco: Never Used  . Alcohol Use: No  . Drug Use: No  . Sexual Activity: Not on file   Other Topics Concern  . Not on file   Social History Narrative    No family history on file.  BP 129/84 mmHg  Pulse 103  Ht 5\' 3"  (1.6 m)  Wt 145 lb (65.772 kg)  BMI 25.69 kg/m2  Review of Systems: See HPI above.    Objective:  Physical Exam:  Gen: NAD, comfortable in exam room  Back: No gross deformity, scoliosis. TTP mildly at SI joints and bilateral trochanters.  No midline or bony TTP. FROM. Strength LEs 5/5 all muscle groups.   2+ MSRs in patellar and achilles tendons, equal bilaterally. Negative SLRs. Sensation intact to light touch bilaterally.  Bilateral hips: Negative  logroll. Mild pain bilateral SI joints with fabers. Negative piriformis bilaterally.    Assessment & Plan:  1. 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  Start with diclofenac twice a day, heat for spasms.  Shown home exercises and stretches to do daily.  We reviewed physical therapy vs chiropractic care - she will consider which to pursue.  Consider prednisone dose pack if not improving.  F/u in 1 month for reevaluation.  No evidence on exam or MRI of an inflammatory arthropathy.

## 2016-02-26 ENCOUNTER — Telehealth: Payer: Self-pay | Admitting: Family Medicine

## 2016-02-27 NOTE — Telephone Encounter (Signed)
Spoke to patient and told her that we did not prescribe the Zofran.

## 2016-02-27 NOTE — Telephone Encounter (Signed)
Sorry the Zofran was left over from when she got it from a previous provider - we did not prescribe that.  When I completed her note I deleted this medication from her record.

## 2016-02-28 DIAGNOSIS — M9903 Segmental and somatic dysfunction of lumbar region: Secondary | ICD-10-CM | POA: Diagnosis not present

## 2016-02-28 DIAGNOSIS — M9905 Segmental and somatic dysfunction of pelvic region: Secondary | ICD-10-CM | POA: Diagnosis not present

## 2016-02-28 DIAGNOSIS — M9902 Segmental and somatic dysfunction of thoracic region: Secondary | ICD-10-CM | POA: Diagnosis not present

## 2016-02-28 DIAGNOSIS — M9904 Segmental and somatic dysfunction of sacral region: Secondary | ICD-10-CM | POA: Diagnosis not present

## 2016-03-05 DIAGNOSIS — M9905 Segmental and somatic dysfunction of pelvic region: Secondary | ICD-10-CM | POA: Diagnosis not present

## 2016-03-05 DIAGNOSIS — M9902 Segmental and somatic dysfunction of thoracic region: Secondary | ICD-10-CM | POA: Diagnosis not present

## 2016-03-05 DIAGNOSIS — M9903 Segmental and somatic dysfunction of lumbar region: Secondary | ICD-10-CM | POA: Diagnosis not present

## 2016-03-05 DIAGNOSIS — M9904 Segmental and somatic dysfunction of sacral region: Secondary | ICD-10-CM | POA: Diagnosis not present

## 2016-03-13 DIAGNOSIS — M9905 Segmental and somatic dysfunction of pelvic region: Secondary | ICD-10-CM | POA: Diagnosis not present

## 2016-03-13 DIAGNOSIS — M9904 Segmental and somatic dysfunction of sacral region: Secondary | ICD-10-CM | POA: Diagnosis not present

## 2016-03-13 DIAGNOSIS — M9902 Segmental and somatic dysfunction of thoracic region: Secondary | ICD-10-CM | POA: Diagnosis not present

## 2016-03-13 DIAGNOSIS — M9903 Segmental and somatic dysfunction of lumbar region: Secondary | ICD-10-CM | POA: Diagnosis not present

## 2016-03-27 ENCOUNTER — Ambulatory Visit: Payer: BLUE CROSS/BLUE SHIELD | Admitting: Family Medicine

## 2016-03-27 DIAGNOSIS — M9903 Segmental and somatic dysfunction of lumbar region: Secondary | ICD-10-CM | POA: Diagnosis not present

## 2016-03-27 DIAGNOSIS — M9905 Segmental and somatic dysfunction of pelvic region: Secondary | ICD-10-CM | POA: Diagnosis not present

## 2016-03-27 DIAGNOSIS — M9902 Segmental and somatic dysfunction of thoracic region: Secondary | ICD-10-CM | POA: Diagnosis not present

## 2016-03-27 DIAGNOSIS — M9904 Segmental and somatic dysfunction of sacral region: Secondary | ICD-10-CM | POA: Diagnosis not present

## 2016-04-04 DIAGNOSIS — M9902 Segmental and somatic dysfunction of thoracic region: Secondary | ICD-10-CM | POA: Diagnosis not present

## 2016-04-04 DIAGNOSIS — M9905 Segmental and somatic dysfunction of pelvic region: Secondary | ICD-10-CM | POA: Diagnosis not present

## 2016-04-04 DIAGNOSIS — M9904 Segmental and somatic dysfunction of sacral region: Secondary | ICD-10-CM | POA: Diagnosis not present

## 2016-04-04 DIAGNOSIS — M9903 Segmental and somatic dysfunction of lumbar region: Secondary | ICD-10-CM | POA: Diagnosis not present

## 2016-05-02 DIAGNOSIS — M9903 Segmental and somatic dysfunction of lumbar region: Secondary | ICD-10-CM | POA: Diagnosis not present

## 2016-05-02 DIAGNOSIS — M9902 Segmental and somatic dysfunction of thoracic region: Secondary | ICD-10-CM | POA: Diagnosis not present

## 2016-05-02 DIAGNOSIS — M9904 Segmental and somatic dysfunction of sacral region: Secondary | ICD-10-CM | POA: Diagnosis not present

## 2016-05-02 DIAGNOSIS — M9905 Segmental and somatic dysfunction of pelvic region: Secondary | ICD-10-CM | POA: Diagnosis not present

## 2016-05-09 DIAGNOSIS — M9903 Segmental and somatic dysfunction of lumbar region: Secondary | ICD-10-CM | POA: Diagnosis not present

## 2016-05-09 DIAGNOSIS — M9904 Segmental and somatic dysfunction of sacral region: Secondary | ICD-10-CM | POA: Diagnosis not present

## 2016-05-09 DIAGNOSIS — M9905 Segmental and somatic dysfunction of pelvic region: Secondary | ICD-10-CM | POA: Diagnosis not present

## 2016-05-09 DIAGNOSIS — M9902 Segmental and somatic dysfunction of thoracic region: Secondary | ICD-10-CM | POA: Diagnosis not present

## 2016-05-19 DIAGNOSIS — R591 Generalized enlarged lymph nodes: Secondary | ICD-10-CM | POA: Diagnosis not present

## 2016-05-19 DIAGNOSIS — J029 Acute pharyngitis, unspecified: Secondary | ICD-10-CM | POA: Diagnosis not present

## 2016-05-27 DIAGNOSIS — M9904 Segmental and somatic dysfunction of sacral region: Secondary | ICD-10-CM | POA: Diagnosis not present

## 2016-05-27 DIAGNOSIS — M9902 Segmental and somatic dysfunction of thoracic region: Secondary | ICD-10-CM | POA: Diagnosis not present

## 2016-05-27 DIAGNOSIS — M9905 Segmental and somatic dysfunction of pelvic region: Secondary | ICD-10-CM | POA: Diagnosis not present

## 2016-05-27 DIAGNOSIS — M9903 Segmental and somatic dysfunction of lumbar region: Secondary | ICD-10-CM | POA: Diagnosis not present

## 2016-05-31 DIAGNOSIS — M9904 Segmental and somatic dysfunction of sacral region: Secondary | ICD-10-CM | POA: Diagnosis not present

## 2016-05-31 DIAGNOSIS — M9902 Segmental and somatic dysfunction of thoracic region: Secondary | ICD-10-CM | POA: Diagnosis not present

## 2016-05-31 DIAGNOSIS — M9903 Segmental and somatic dysfunction of lumbar region: Secondary | ICD-10-CM | POA: Diagnosis not present

## 2016-05-31 DIAGNOSIS — M9905 Segmental and somatic dysfunction of pelvic region: Secondary | ICD-10-CM | POA: Diagnosis not present

## 2016-06-04 DIAGNOSIS — M9902 Segmental and somatic dysfunction of thoracic region: Secondary | ICD-10-CM | POA: Diagnosis not present

## 2016-06-04 DIAGNOSIS — M9905 Segmental and somatic dysfunction of pelvic region: Secondary | ICD-10-CM | POA: Diagnosis not present

## 2016-06-04 DIAGNOSIS — M9903 Segmental and somatic dysfunction of lumbar region: Secondary | ICD-10-CM | POA: Diagnosis not present

## 2016-06-04 DIAGNOSIS — M9904 Segmental and somatic dysfunction of sacral region: Secondary | ICD-10-CM | POA: Diagnosis not present

## 2016-06-14 DIAGNOSIS — M9903 Segmental and somatic dysfunction of lumbar region: Secondary | ICD-10-CM | POA: Diagnosis not present

## 2016-06-14 DIAGNOSIS — M9904 Segmental and somatic dysfunction of sacral region: Secondary | ICD-10-CM | POA: Diagnosis not present

## 2016-06-14 DIAGNOSIS — M9905 Segmental and somatic dysfunction of pelvic region: Secondary | ICD-10-CM | POA: Diagnosis not present

## 2016-06-14 DIAGNOSIS — M9902 Segmental and somatic dysfunction of thoracic region: Secondary | ICD-10-CM | POA: Diagnosis not present

## 2016-06-19 DIAGNOSIS — M9905 Segmental and somatic dysfunction of pelvic region: Secondary | ICD-10-CM | POA: Diagnosis not present

## 2016-06-19 DIAGNOSIS — M9904 Segmental and somatic dysfunction of sacral region: Secondary | ICD-10-CM | POA: Diagnosis not present

## 2016-06-19 DIAGNOSIS — M9902 Segmental and somatic dysfunction of thoracic region: Secondary | ICD-10-CM | POA: Diagnosis not present

## 2016-06-19 DIAGNOSIS — M9903 Segmental and somatic dysfunction of lumbar region: Secondary | ICD-10-CM | POA: Diagnosis not present

## 2016-07-04 DIAGNOSIS — M546 Pain in thoracic spine: Secondary | ICD-10-CM | POA: Diagnosis not present

## 2016-07-04 DIAGNOSIS — M542 Cervicalgia: Secondary | ICD-10-CM | POA: Diagnosis not present

## 2016-07-23 DIAGNOSIS — M542 Cervicalgia: Secondary | ICD-10-CM | POA: Diagnosis not present

## 2016-09-24 DIAGNOSIS — R197 Diarrhea, unspecified: Secondary | ICD-10-CM | POA: Diagnosis not present

## 2016-09-24 DIAGNOSIS — J019 Acute sinusitis, unspecified: Secondary | ICD-10-CM | POA: Diagnosis not present

## 2017-03-17 DIAGNOSIS — S93401A Sprain of unspecified ligament of right ankle, initial encounter: Secondary | ICD-10-CM | POA: Diagnosis not present

## 2017-03-26 ENCOUNTER — Ambulatory Visit (INDEPENDENT_AMBULATORY_CARE_PROVIDER_SITE_OTHER): Payer: BLUE CROSS/BLUE SHIELD | Admitting: Family Medicine

## 2017-03-26 ENCOUNTER — Encounter: Payer: Self-pay | Admitting: Family Medicine

## 2017-03-26 ENCOUNTER — Ambulatory Visit (HOSPITAL_BASED_OUTPATIENT_CLINIC_OR_DEPARTMENT_OTHER)
Admission: RE | Admit: 2017-03-26 | Discharge: 2017-03-26 | Disposition: A | Discharge status: Home / Self Care | Payer: BLUE CROSS/BLUE SHIELD | Source: Ambulatory Visit | Attending: Family Medicine | Admitting: Family Medicine

## 2017-03-26 VITALS — BP 112/80 | HR 105 | Ht 63.0 in | Wt 140.0 lb

## 2017-03-26 DIAGNOSIS — M7731 Calcaneal spur, right foot: Secondary | ICD-10-CM | POA: Diagnosis not present

## 2017-03-26 DIAGNOSIS — X58XXXA Exposure to other specified factors, initial encounter: Secondary | ICD-10-CM | POA: Insufficient documentation

## 2017-03-26 DIAGNOSIS — S99911A Unspecified injury of right ankle, initial encounter: Secondary | ICD-10-CM

## 2017-03-26 DIAGNOSIS — S99911D Unspecified injury of right ankle, subsequent encounter: Secondary | ICD-10-CM | POA: Insufficient documentation

## 2017-03-26 NOTE — Patient Instructions (Signed)
You have a high ankle sprain. Ice the area for 15 minutes at a time, 3-4 times a day Aleve 2 tabs twice a day with food OR ibuprofen 3 tabs three times a day with food for pain and inflammation. Elevate above the level of your heart when possible Crutches if needed to help with walking Bear weight when tolerated Cam walker when up and walking around. Come out of the boot twice a day to do Up/down and alphabet exercises 2-3 sets of each. Consider physical therapy for strengthening and balance exercises in the future. Follow up with me in 2 weeks - expect to start physical therapy at this point, switch to a laceup ankle brace.

## 2017-03-26 NOTE — Assessment & Plan Note (Signed)
independently reviewed radiographs and no evidence fracture.  Consistent with high ankle sprain.  Shown home motion exercises to do daily.  Aleve or ibuprofen, icing, elevation.  Continue cam walker.  Bear weight as tolerated.  F/u in 2 weeks

## 2017-03-26 NOTE — Progress Notes (Signed)
PCP: Johny BlamerHarris, William, MD  Subjective:   HPI: Patient is a 36 y.o. female here for right ankle injury.  Patient reports on 7/1 she was getting off a plane onto the tarmac when she missed a step and fell down about 2 feet hyperplantarflexing her right ankle. Unable to bear weight. + swelling. Pain deep anterior ankle. [place] - told this was negative and would need an MRI. Pain worse by end of day and with walking. Worse at nighttime.using cam walker, icing, taking naprosyn. No prior injuries. No skin changes, numbness.  No past medical history on file.  Current Outpatient Prescriptions on File Prior to Visit  Medication Sig Dispense Refill  . diclofenac (VOLTAREN) 75 MG EC tablet Take 1 tablet (75 mg total) by mouth 2 (two) times daily. 60 tablet 1  . levonorgestrel (MIRENA) 20 MCG/24HR IUD by Intrauterine route.     No current facility-administered medications on file prior to visit.     No past surgical history on file.  Allergies  Allergen Reactions  . Pseudoephedrine     Social History   Social History  . Marital status: Married    Spouse name: N/A  . Number of children: N/A  . Years of education: N/A   Occupational History  . Not on file.   Social History Main Topics  . Smoking status: Never Smoker  . Smokeless tobacco: Never Used  . Alcohol use No  . Drug use: No  . Sexual activity: Not on file   Other Topics Concern  . Not on file   Social History Narrative  . No narrative on file    No family history on file.  BP 112/80   Pulse (!) 105   Ht 5\' 3"  (1.6 m)   Wt 140 lb (63.5 kg)   BMI 24.80 kg/m   Review of Systems: See HPI above.     Objective:  Physical Exam:  Gen: NAD, comfortable in exam room  Right ankle: Mild swelling but no bruising, other deformity. FROM with pain all motions mildly. TTP over ant ankle joint, medial malleolus, ATFL. 1+ ant drawer, negative talar tilt. Positive syndesmotic  compression. Positive kleigers. Thompsons test negative. NV intact distally.  Left ankle: FROM without pain.   Assessment & Plan:  1. Right ankle injury - independently reviewed radiographs and no evidence fracture.  Consistent with high ankle sprain.  Shown home motion exercises to do daily.  Aleve or ibuprofen, icing, elevation.  Continue cam walker.  Bear weight as tolerated.  F/u in 2 weeks

## 2017-04-09 ENCOUNTER — Ambulatory Visit: Payer: BLUE CROSS/BLUE SHIELD | Admitting: Family Medicine

## 2017-04-10 ENCOUNTER — Ambulatory Visit: Payer: BLUE CROSS/BLUE SHIELD | Admitting: Family Medicine

## 2017-04-16 ENCOUNTER — Encounter: Payer: Self-pay | Admitting: Family Medicine

## 2017-04-16 ENCOUNTER — Ambulatory Visit (INDEPENDENT_AMBULATORY_CARE_PROVIDER_SITE_OTHER): Payer: BLUE CROSS/BLUE SHIELD | Admitting: Family Medicine

## 2017-04-16 DIAGNOSIS — S99911D Unspecified injury of right ankle, subsequent encounter: Secondary | ICD-10-CM

## 2017-04-16 NOTE — Patient Instructions (Signed)
You have a high ankle sprain. Ice the area for 15 minutes at a time, 3-4 times a day Aleve 2 tabs twice a day with food OR ibuprofen 3 tabs three times a day with food for pain and inflammation. Elevate above the level of your heart when possible Knee scooter or crutches if needed to help with walking Cam walker when up and walking around - [place]ing into the laceup brace if you can. Come out of the boot twice a day to do Up/down and alphabet exercises 2-3 sets of each. Start physical therapy for strengthening and balance exercises. Follow up with me in 1 month. If not improving as expected we will do an MRI.

## 2017-04-16 NOTE — Assessment & Plan Note (Signed)
Radiographs were negative.  Exam consistent with high ankle sprain.  No evidence of OCD though we discussed if she does not improve with physical therapy over the next month advised should go ahead with MRI.  Transition to ASO from cam walker.  Bear weight as tolerated.  Aleve or ibuprofen if needed.  F/u in 1 month.

## 2017-04-16 NOTE — Progress Notes (Signed)
PCP: Johny BlamerHarris, William, MD  Subjective:   HPI: Patient is a 36 y.o. female here for right ankle injury.  7/11: Patient reports on 7/1 she was getting off a plane onto the tarmac when she missed a step and fell down about 2 feet hyperplantarflexing her right ankle. Unable to bear weight. + swelling. Pain deep anterior ankle. Had a single x-ray in [place] - told this was negative and would need an MRI. Pain worse by end of day and with walking. Worse at nighttime.using cam walker, icing, taking naprosyn. No prior injuries. No skin changes, numbness.  8/1: Patient reports she feels mildly improved from last visit. Pain level down to 4/10, a soreness. Feels medial to lateral ankle. Swelling especially if on feet a lot. Worse with prolonged standing and walking. No skin changes, numbness. Wearing cam walker which helps.  No past medical history on file.  Current Outpatient Prescriptions on File Prior to Visit  Medication Sig Dispense Refill  . diclofenac (VOLTAREN) 75 MG EC tablet Take 1 tablet (75 mg total) by mouth 2 (two) times daily. 60 tablet 1  . levonorgestrel (MIRENA) 20 MCG/24HR IUD by Intrauterine route.     No current facility-administered medications on file prior to visit.     No past surgical history on file.  Allergies  Allergen Reactions  . Pseudoephedrine     Social History   Social History  . Marital status: Married    Spouse name: N/A  . Number of children: N/A  . Years of education: N/A   Occupational History  . Not on file.   Social History Main Topics  . Smoking status: Never Smoker  . Smokeless tobacco: Never Used  . Alcohol use No  . Drug use: No  . Sexual activity: Not on file   Other Topics Concern  . Not on file   Social History Narrative  . No narrative on file    No family history on file.  BP 119/75   Pulse (!) 120   Ht 5\' 3"  (1.6 m)   Wt 140 lb (63.5 kg)   BMI 24.80 kg/m   Review of Systems: See HPI above.      Objective:  Physical Exam:  Gen: NAD, comfortable in exam room  Right ankle: No swelling, bruising, deformity currently. FROM with pain all motions mildly. TTP over ant ankle joint, medial malleolus, lateral malleolus. 1+ ant drawer, negative talar tilt. Negative syndesmotic compression. Positive kleigers. Thompsons test negative. NV intact distally.  Left ankle: FROM without pain.   Assessment & Plan:  1. Right ankle injury - Radiographs were negative.  Exam consistent with high ankle sprain.  No evidence of OCD though we discussed if she does not improve with physical therapy over the next month advised should go ahead with MRI.  Transition to ASO from cam walker.  Bear weight as tolerated.  Aleve or ibuprofen if needed.  F/u in 1 month.

## 2017-04-17 NOTE — Addendum Note (Signed)
Addended by: Kathi SimpersWISE, Annalena Piatt F on: 04/17/2017 09:23 AM   Modules accepted: Orders

## 2017-05-05 ENCOUNTER — Telehealth: Payer: Self-pay | Admitting: Family Medicine

## 2017-05-05 NOTE — Telephone Encounter (Signed)
Patient requesting to be referred to a different physical therapy office due to price of copayment. Patient would like to stay in [place] if possible

## 2017-05-05 NOTE — Telephone Encounter (Signed)
Spoke to patient and will refer to a different physical therapy office.

## 2017-05-08 ENCOUNTER — Ambulatory Visit (INDEPENDENT_AMBULATORY_CARE_PROVIDER_SITE_OTHER): Payer: BLUE CROSS/BLUE SHIELD | Admitting: Family Medicine

## 2017-05-08 DIAGNOSIS — R262 Difficulty in walking, not elsewhere classified: Secondary | ICD-10-CM | POA: Diagnosis not present

## 2017-05-08 DIAGNOSIS — M6281 Muscle weakness (generalized): Secondary | ICD-10-CM | POA: Diagnosis not present

## 2017-05-08 DIAGNOSIS — M25571 Pain in right ankle and joints of right foot: Secondary | ICD-10-CM | POA: Diagnosis not present

## 2017-05-08 DIAGNOSIS — S99911D Unspecified injury of right ankle, subsequent encounter: Secondary | ICD-10-CM

## 2017-05-08 NOTE — Patient Instructions (Signed)
We will go ahead with an MRI of your ankle to assess for an osteochondral defect. Keep your physical therapy appointment and do home exercises. Wear the boot - don't force yourself to switch into the laceup brace right now. I will call you with results and next steps.

## 2017-05-08 NOTE — Assessment & Plan Note (Signed)
Radiographs were negative.  Unfortunately she continues to struggle more than 6 weeks out without much improvement.  Pain more medial as well but ultrasound of post tib tendon is normal.  Concern about possible OCD - will go ahead with MRI at this point.  Encouraged to go ahead and start physical therapy.  Continue cam walker.  Aleve or ibuprofen if needed.

## 2017-05-08 NOTE — Progress Notes (Signed)
PCP: Johny Blamer, MD  Subjective:   HPI: Patient is a 36 y.o. female here for right ankle injury.  7/11: Patient reports on 7/1 she was getting off a plane onto the tarmac when she missed a step and fell down about 2 feet hyperplantarflexing her right ankle. Unable to bear weight. + swelling. Pain deep anterior ankle. Had a single x-ray in [place] - told this was negative and would need an MRI. Pain worse by end of day and with walking. Worse at nighttime.using cam walker, icing, taking naprosyn. No prior injuries. No skin changes, numbness.  8/1: Patient reports she feels mildly improved from last visit. Pain level down to 4/10, a soreness. Feels medial to lateral ankle. Swelling especially if on feet a lot. Worse with prolonged standing and walking. No skin changes, numbness. Wearing cam walker which helps.  8/23: Patient reports feeling about the same as last visit. She still cannot use the ASO - has to walk with cam walker. Pain is 4/10, sharp and sore, up to 7/10 by end of day. Feels like a crushing pain medially and anteriorly if she stands on right foot for a length of time. Doing home exercises. Starting physical therapy today. Some swelling still. No numbness, other skin changes.  No past medical history on file.  Current Outpatient Prescriptions on File Prior to Visit  Medication Sig Dispense Refill  . diclofenac (VOLTAREN) 75 MG EC tablet Take 1 tablet (75 mg total) by mouth 2 (two) times daily. 60 tablet 1  . levonorgestrel (MIRENA) 20 MCG/24HR IUD by Intrauterine route.     No current facility-administered medications on file prior to visit.     No past surgical history on file.  Allergies  Allergen Reactions  . Pseudoephedrine     Social History   Social History  . Marital status: Married    Spouse name: N/A  . Number of children: N/A  . Years of education: N/A   Occupational History  . Not on file.   Social History Main Topics  .  Smoking status: Never Smoker  . Smokeless tobacco: Never Used  . Alcohol use No  . Drug use: No  . Sexual activity: Not on file   Other Topics Concern  . Not on file   Social History Narrative  . No narrative on file    No family history on file.  BP 133/79   Pulse (!) 102   Ht 5\' 3"  (1.6 m)   Wt 140 lb (63.5 kg)   BMI 24.80 kg/m   Review of Systems: See HPI above.     Objective:  Physical Exam:  Gen: NAD, comfortable in exam room  Right ankle: No swelling, bruising, deformity currently. FROM with pain on internal rotation, plantarflexion and dorsiflexion. TTP over ant ankle joint, medial malleolus, posterior tibialis tendon. Trace ant drawer, negative talar tilt. Negative syndesmotic compression. Thompsons test negative. NV intact distally.  Left ankle: FROM without pain.   Assessment & Plan:  1. Right ankle injury - Radiographs were negative.  Unfortunately she continues to struggle more than 6 weeks out without much improvement.  Pain more medial as well but ultrasound of post tib tendon is normal.  Concern about possible OCD - will go ahead with MRI at this point.  Encouraged to go ahead and start physical therapy.  Continue cam walker.  Aleve or ibuprofen if needed.

## 2017-05-13 ENCOUNTER — Ambulatory Visit: Payer: BLUE CROSS/BLUE SHIELD | Admitting: Family Medicine

## 2017-05-13 NOTE — Addendum Note (Signed)
Addended by: Kathi Simpers F on: 05/13/2017 10:42 AM   Modules accepted: Orders

## 2017-05-14 DIAGNOSIS — R262 Difficulty in walking, not elsewhere classified: Secondary | ICD-10-CM | POA: Diagnosis not present

## 2017-05-14 DIAGNOSIS — M6281 Muscle weakness (generalized): Secondary | ICD-10-CM | POA: Diagnosis not present

## 2017-05-14 DIAGNOSIS — M25571 Pain in right ankle and joints of right foot: Secondary | ICD-10-CM | POA: Diagnosis not present

## 2017-05-16 DIAGNOSIS — M6281 Muscle weakness (generalized): Secondary | ICD-10-CM | POA: Diagnosis not present

## 2017-05-16 DIAGNOSIS — M25571 Pain in right ankle and joints of right foot: Secondary | ICD-10-CM | POA: Diagnosis not present

## 2017-05-16 DIAGNOSIS — R262 Difficulty in walking, not elsewhere classified: Secondary | ICD-10-CM | POA: Diagnosis not present

## 2017-05-20 ENCOUNTER — Telehealth: Payer: Self-pay | Admitting: Family Medicine

## 2017-05-20 NOTE — Telephone Encounter (Signed)
Not yet - I haven't seen the MRI results yet - has she even had it done yet?

## 2017-05-20 NOTE — Telephone Encounter (Signed)
Elizabeth RiegerLaura with [facility] calling to see if patient can progressively be worked out of the boot during physical therapy  Also requested MRI results to be faxed.

## 2017-05-20 NOTE — Telephone Encounter (Signed)
Called [facility] and appointment was rescheduled to 05-21-17.

## 2017-05-21 DIAGNOSIS — M25571 Pain in right ankle and joints of right foot: Secondary | ICD-10-CM | POA: Diagnosis not present

## 2017-05-22 NOTE — Telephone Encounter (Signed)
Gunnar FusiPaula - please call and let Vernona RiegerLaura know that patient's MRI was normal.  Thanks!

## 2017-05-23 NOTE — Telephone Encounter (Signed)
Yes thanks 

## 2017-05-23 NOTE — Telephone Encounter (Signed)
Called and gave information about MRI. She asked if the patient can be worked out of the boot during PT.

## 2017-05-23 NOTE — Telephone Encounter (Signed)
Information given

## 2017-05-26 DIAGNOSIS — M6281 Muscle weakness (generalized): Secondary | ICD-10-CM | POA: Diagnosis not present

## 2017-05-26 DIAGNOSIS — M25571 Pain in right ankle and joints of right foot: Secondary | ICD-10-CM | POA: Diagnosis not present

## 2017-05-26 DIAGNOSIS — R262 Difficulty in walking, not elsewhere classified: Secondary | ICD-10-CM | POA: Diagnosis not present

## 2017-05-28 DIAGNOSIS — M25571 Pain in right ankle and joints of right foot: Secondary | ICD-10-CM | POA: Diagnosis not present

## 2017-05-28 DIAGNOSIS — M6281 Muscle weakness (generalized): Secondary | ICD-10-CM | POA: Diagnosis not present

## 2017-05-28 DIAGNOSIS — R262 Difficulty in walking, not elsewhere classified: Secondary | ICD-10-CM | POA: Diagnosis not present

## 2017-06-03 DIAGNOSIS — M25571 Pain in right ankle and joints of right foot: Secondary | ICD-10-CM | POA: Diagnosis not present

## 2017-06-03 DIAGNOSIS — M6281 Muscle weakness (generalized): Secondary | ICD-10-CM | POA: Diagnosis not present

## 2017-06-03 DIAGNOSIS — R262 Difficulty in walking, not elsewhere classified: Secondary | ICD-10-CM | POA: Diagnosis not present

## 2017-06-05 DIAGNOSIS — R262 Difficulty in walking, not elsewhere classified: Secondary | ICD-10-CM | POA: Diagnosis not present

## 2017-06-05 DIAGNOSIS — M25571 Pain in right ankle and joints of right foot: Secondary | ICD-10-CM | POA: Diagnosis not present

## 2017-06-05 DIAGNOSIS — M6281 Muscle weakness (generalized): Secondary | ICD-10-CM | POA: Diagnosis not present

## 2017-06-09 ENCOUNTER — Encounter: Payer: Self-pay | Admitting: Family Medicine

## 2017-06-11 DIAGNOSIS — M6281 Muscle weakness (generalized): Secondary | ICD-10-CM | POA: Diagnosis not present

## 2017-06-11 DIAGNOSIS — R262 Difficulty in walking, not elsewhere classified: Secondary | ICD-10-CM | POA: Diagnosis not present

## 2017-06-11 DIAGNOSIS — M25571 Pain in right ankle and joints of right foot: Secondary | ICD-10-CM | POA: Diagnosis not present

## 2017-06-11 NOTE — Telephone Encounter (Signed)
Patient was referred to [facility] on 8/22 for 6wks of physical therapy

## 2017-06-12 DIAGNOSIS — M25571 Pain in right ankle and joints of right foot: Secondary | ICD-10-CM | POA: Diagnosis not present

## 2017-06-12 DIAGNOSIS — R262 Difficulty in walking, not elsewhere classified: Secondary | ICD-10-CM | POA: Diagnosis not present

## 2017-06-12 DIAGNOSIS — M6281 Muscle weakness (generalized): Secondary | ICD-10-CM | POA: Diagnosis not present

## 2017-07-03 DIAGNOSIS — M6281 Muscle weakness (generalized): Secondary | ICD-10-CM | POA: Diagnosis not present

## 2017-07-03 DIAGNOSIS — R262 Difficulty in walking, not elsewhere classified: Secondary | ICD-10-CM | POA: Diagnosis not present

## 2017-07-03 DIAGNOSIS — M25571 Pain in right ankle and joints of right foot: Secondary | ICD-10-CM | POA: Diagnosis not present

## 2017-07-04 DIAGNOSIS — M6281 Muscle weakness (generalized): Secondary | ICD-10-CM | POA: Diagnosis not present

## 2017-07-04 DIAGNOSIS — M25571 Pain in right ankle and joints of right foot: Secondary | ICD-10-CM | POA: Diagnosis not present

## 2017-07-04 DIAGNOSIS — R262 Difficulty in walking, not elsewhere classified: Secondary | ICD-10-CM | POA: Diagnosis not present

## 2017-08-04 DIAGNOSIS — J069 Acute upper respiratory infection, unspecified: Secondary | ICD-10-CM | POA: Diagnosis not present

## 2017-08-04 DIAGNOSIS — J029 Acute pharyngitis, unspecified: Secondary | ICD-10-CM | POA: Diagnosis not present

## 2017-10-15 DIAGNOSIS — Z13220 Encounter for screening for lipoid disorders: Secondary | ICD-10-CM | POA: Diagnosis not present

## 2017-10-15 DIAGNOSIS — Z01419 Encounter for gynecological examination (general) (routine) without abnormal findings: Secondary | ICD-10-CM | POA: Diagnosis not present

## 2017-10-15 DIAGNOSIS — Z6825 Body mass index (BMI) 25.0-25.9, adult: Secondary | ICD-10-CM | POA: Diagnosis not present

## 2017-11-03 DIAGNOSIS — J029 Acute pharyngitis, unspecified: Secondary | ICD-10-CM | POA: Diagnosis not present

## 2017-11-03 DIAGNOSIS — J101 Influenza due to other identified influenza virus with other respiratory manifestations: Secondary | ICD-10-CM | POA: Diagnosis not present

## 2018-01-15 DIAGNOSIS — Z30433 Encounter for removal and reinsertion of intrauterine contraceptive device: Secondary | ICD-10-CM | POA: Diagnosis not present

## 2018-01-26 IMAGING — DX DG ANKLE COMPLETE 3+V*R*
3 series · 3 of 3 positions shown · non-contrast
Comparison: None.

CLINICAL DATA: Fell [DATE] hyperextending rt ankle. Pain across top of
ankle, swelling at end of day.

EXAM:
RIGHT ANKLE - COMPLETE 3+ VIEW

[ankle ap]
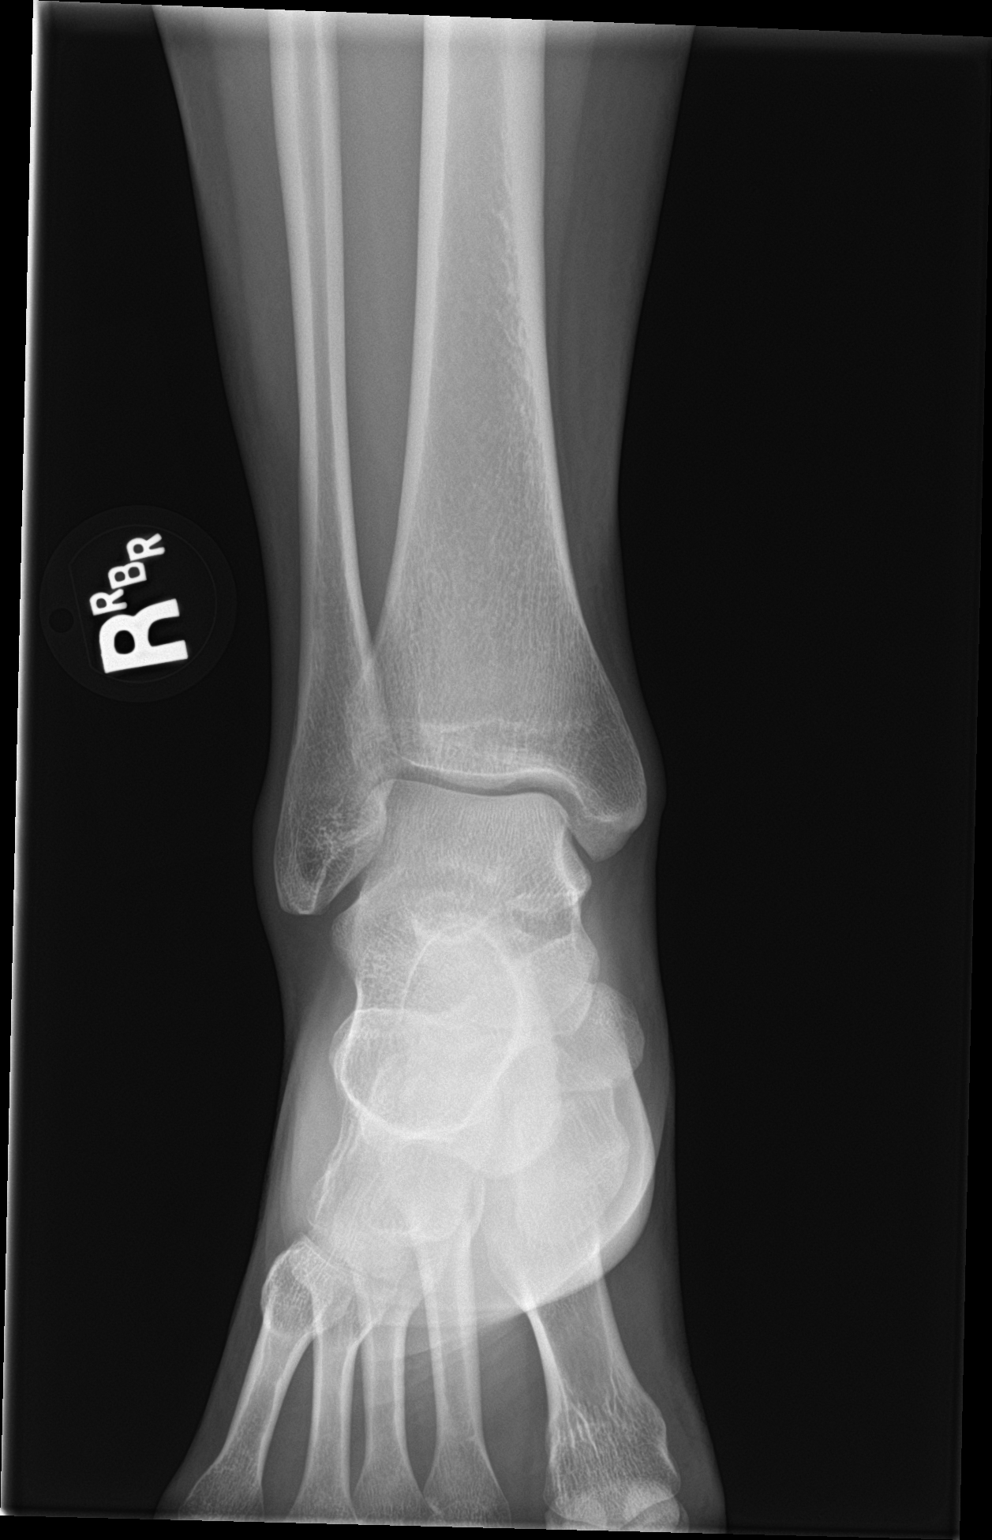

[ankle obl]
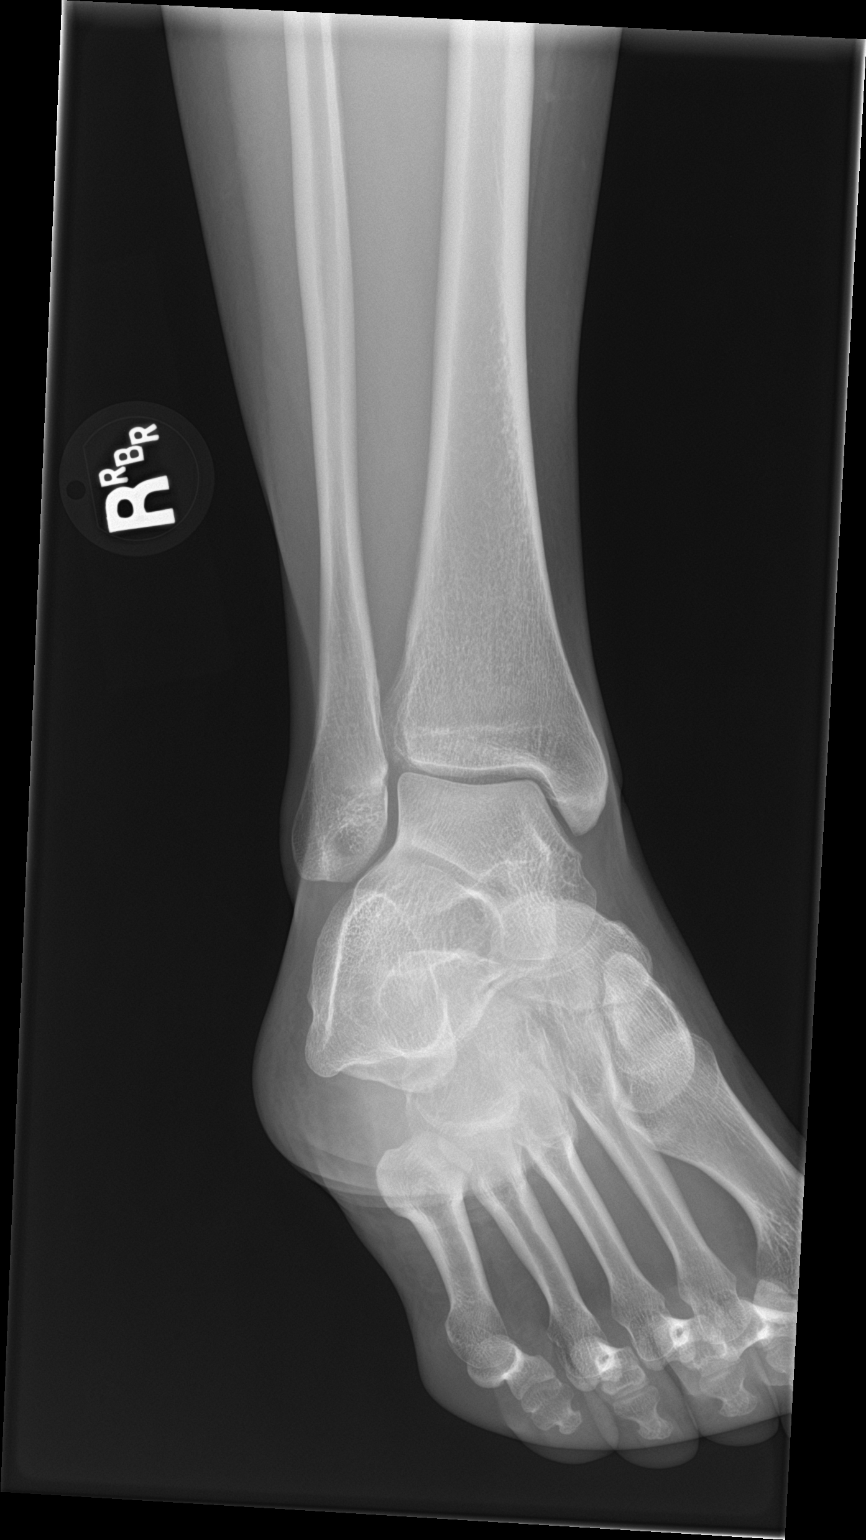

[ankle lat]
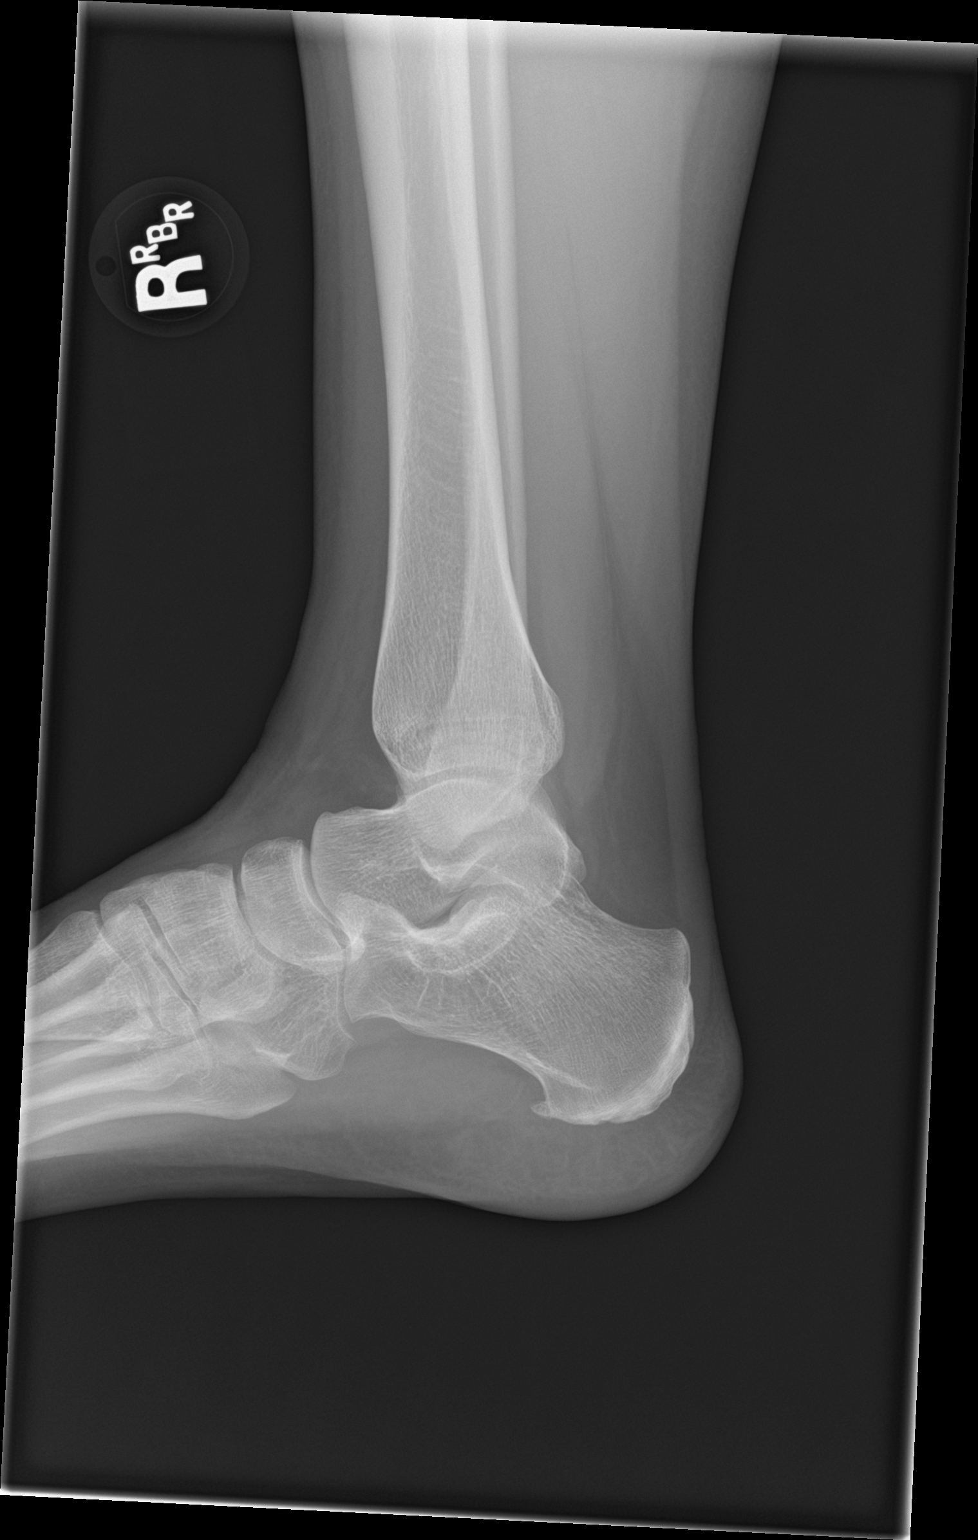

[3 of 3 positions shown; findings below may reference images not displayed]

FINDINGS: There is no evidence of fracture, dislocation, or joint effusion.
There is no evidence of arthropathy or other focal bone abnormality.
Small calcaneal spur. Soft tissues are unremarkable.
IMPRESSION: Small calcaneal spur, otherwise Negative.

## 2018-02-19 DIAGNOSIS — N939 Abnormal uterine and vaginal bleeding, unspecified: Secondary | ICD-10-CM | POA: Diagnosis not present

## 2018-06-15 ENCOUNTER — Ambulatory Visit: Payer: BLUE CROSS/BLUE SHIELD | Admitting: Family Medicine

## 2018-06-16 ENCOUNTER — Ambulatory Visit: Payer: BLUE CROSS/BLUE SHIELD | Admitting: Family Medicine

## 2018-06-18 ENCOUNTER — Encounter: Payer: Self-pay | Admitting: Family Medicine

## 2018-06-18 ENCOUNTER — Ambulatory Visit: Payer: BLUE CROSS/BLUE SHIELD | Admitting: Family Medicine

## 2018-06-18 DIAGNOSIS — S99911A Unspecified injury of right ankle, initial encounter: Secondary | ICD-10-CM

## 2018-06-18 NOTE — Progress Notes (Signed)
     Subjective: Chief Complaint  Patient presents with  . Follow-up    follow up for right ankle     HPI: [name] is a 37 y.o. presenting to clinic today to discuss the following:  Right Ankle Pain Patient presents with 2 weeks of right ankle pain and swelling. She states she had a significant injury to the same ankle about 1 year ago that required her to wear a boot for several weeks and go to formal physical therapy but she completely improved from this.  Today she states her swelling is mostly on the lateral side of her ankle but her pain is on the medial side of her ankle and working out and running make it worse. Pain is rated at a 4/10 and does not radiate, can be sharp. Patient was able to bear weight immediately after injury.  No skin changes, numbness.  Health Maintenance: none today    ROS noted in HPI.   Past Medical, Surgical, Social, and Family History Reviewed & Updated per EMR.   Pertinent Historical Findings include:   Social History   Tobacco Use  Smoking Status Never Smoker  Smokeless Tobacco Never Used    Objective: BP 109/81   Pulse (!) 110   Ht 5\' 3"  (1.6 m)   Wt 140 lb (63.5 kg)   BMI 24.80 kg/m  Vitals and nursing notes reviewed  Physical Exam Gen: Alert and Oriented x 3, NAD HEENT: Normocephalic, atraumatic MSK: Moves all four extremities;   Right ankle: TTP at the posterior tibialis tendon where it runs under the medial malleolus. Mild swelling on the lateral malleolus which is non-tender. Right ankle has FROM and ankle ligaments all intact. She does exhibit some mild weakness with with inversion against resistance othewise 5/5 strength in her ankle. TTP in the syndesmosis area.  Left ankle: No deformity. FROM with 5/5 strength. No tenderness to palpation. NVI distally.  Ext: no clubbing, cyanosis, or edema Neuro: No gross deficits Skin: warm, dry, intact, no rashes   No results found for this or any previous visit (from the  past 72 hour(s)).  Assessment/Plan:  Right ankle injury, subsequent encounter Most likely a Grade 1 high ankle sprain with mild posterior tibialis tenosynovitis seen on ultrasound. - At home PT with strength and balance exercises - Arch support - Motrin and Tylenol as needed - Rest, ice, compression as needed  PATIENT EDUCATION PROVIDED: See AVS    Diagnosis and plan along with any newly prescribed medication(s) were discussed in detail with this patient today. The patient verbalized understanding and agreed with the plan. Patient advised if symptoms worsen return to clinic or ER.   Health Maintainance:   No orders of the defined types were placed in this encounter.   No orders of the defined types were placed in this encounter.    Jules Schick, DO 06/18/2018, 11:09 AM PGY-2 [facility]

## 2018-06-18 NOTE — Patient Instructions (Signed)
You have a mild high ankle sprain but your major issue is posterior tibialis tendon strain/tenosynovitis. Ice the area for 15 minutes at a time, 3-4 times a day Aleve 2 tabs twice a day with food OR ibuprofen 3 tabs three times a day with food for pain and inflammation usually for 7-10 days then as needed. Arch support is important - something like spencos, dr. Jari [employer]man active series, superfeet, or our green insoles with scaphoid pads. Twice a day do Up/down and alphabet exercises 2-3 sets of each. Start theraband strengthening exercises - once a day 3 sets of 10. When tolerated start the balance exercises - single leg stance, same with reaching, then with reaching downwards each arm. Follow up in 4-6 weeks. All activities as tolerated.

## 2018-06-18 NOTE — Assessment & Plan Note (Addendum)
Most likely a Grade 1 high ankle sprain with mild posterior tibialis tenosynovitis seen on ultrasound. - At home PT with strength and balance exercises - Arch support - Motrin and Tylenol as needed - Rest, ice, compression as needed

## 2018-06-20 ENCOUNTER — Encounter: Payer: Self-pay | Admitting: Family Medicine

## 2018-07-21 ENCOUNTER — Ambulatory Visit: Payer: BLUE CROSS/BLUE SHIELD | Admitting: Family Medicine

## 2019-04-07 DIAGNOSIS — Z20828 Contact with and (suspected) exposure to other viral communicable diseases: Secondary | ICD-10-CM | POA: Diagnosis not present

## 2019-08-06 DIAGNOSIS — Z1151 Encounter for screening for human papillomavirus (HPV): Secondary | ICD-10-CM | POA: Diagnosis not present

## 2019-08-06 DIAGNOSIS — Z01419 Encounter for gynecological examination (general) (routine) without abnormal findings: Secondary | ICD-10-CM | POA: Diagnosis not present

## 2019-08-06 DIAGNOSIS — Z6829 Body mass index (BMI) 29.0-29.9, adult: Secondary | ICD-10-CM | POA: Diagnosis not present

## 2019-08-31 DIAGNOSIS — Z20828 Contact with and (suspected) exposure to other viral communicable diseases: Secondary | ICD-10-CM | POA: Diagnosis not present

## 2019-11-11 DIAGNOSIS — H5203 Hypermetropia, bilateral: Secondary | ICD-10-CM | POA: Diagnosis not present

## 2020-01-26 DIAGNOSIS — M9903 Segmental and somatic dysfunction of lumbar region: Secondary | ICD-10-CM | POA: Diagnosis not present

## 2020-01-26 DIAGNOSIS — M9905 Segmental and somatic dysfunction of pelvic region: Secondary | ICD-10-CM | POA: Diagnosis not present

## 2020-01-26 DIAGNOSIS — M9902 Segmental and somatic dysfunction of thoracic region: Secondary | ICD-10-CM | POA: Diagnosis not present

## 2020-01-26 DIAGNOSIS — M9904 Segmental and somatic dysfunction of sacral region: Secondary | ICD-10-CM | POA: Diagnosis not present

## 2020-03-27 DIAGNOSIS — Z20822 Contact with and (suspected) exposure to covid-19: Secondary | ICD-10-CM | POA: Diagnosis not present

## 2020-08-30 DIAGNOSIS — S0501XA Injury of conjunctiva and corneal abrasion without foreign body, right eye, initial encounter: Secondary | ICD-10-CM | POA: Diagnosis not present
